# Patient Record
Sex: Female | Born: 1960 | Race: White | Hispanic: No | Smoking: Former smoker
Health system: Southern US, Community
[De-identification: ages and names within clinical notes are randomized; demographics above are authoritative.]

## PROBLEM LIST (undated history)

## (undated) DIAGNOSIS — J45909 Unspecified asthma, uncomplicated: Secondary | ICD-10-CM

## (undated) DIAGNOSIS — G4733 Obstructive sleep apnea (adult) (pediatric): Secondary | ICD-10-CM

## (undated) DIAGNOSIS — I2510 Atherosclerotic heart disease of native coronary artery without angina pectoris: Secondary | ICD-10-CM

## (undated) DIAGNOSIS — J309 Allergic rhinitis, unspecified: Secondary | ICD-10-CM

## (undated) DIAGNOSIS — K219 Gastro-esophageal reflux disease without esophagitis: Secondary | ICD-10-CM

## (undated) HISTORY — DX: Obstructive sleep apnea (adult) (pediatric): G47.33

## (undated) HISTORY — PX: ABDOMINAL HYSTERECTOMY: SHX81

## (undated) HISTORY — DX: Atherosclerotic heart disease of native coronary artery without angina pectoris: I25.10

## (undated) HISTORY — DX: Allergic rhinitis, unspecified: J30.9

## (undated) HISTORY — PX: KIDNEY STONE SURGERY: SHX686

## (undated) HISTORY — PX: APPENDECTOMY: SHX54

## (undated) HISTORY — DX: Gastro-esophageal reflux disease without esophagitis: K21.9

## (undated) HISTORY — DX: Unspecified asthma, uncomplicated: J45.909

---

## 2001-08-21 ENCOUNTER — Ambulatory Visit (HOSPITAL_COMMUNITY): Admission: RE | Admit: 2001-08-21 | Discharge: 2001-08-21 | Payer: Self-pay | Admitting: Radiology

## 2001-08-29 ENCOUNTER — Encounter: Admission: RE | Admit: 2001-08-29 | Discharge: 2001-08-29 | Payer: Self-pay | Admitting: Radiology

## 2007-01-23 ENCOUNTER — Other Ambulatory Visit: Admission: RE | Admit: 2007-01-23 | Discharge: 2007-01-23 | Payer: Self-pay | Admitting: Obstetrics and Gynecology

## 2007-02-01 ENCOUNTER — Ambulatory Visit (HOSPITAL_COMMUNITY): Admission: RE | Admit: 2007-02-01 | Discharge: 2007-02-01 | Payer: Self-pay | Admitting: Obstetrics and Gynecology

## 2008-07-03 ENCOUNTER — Encounter: Admission: RE | Admit: 2008-07-03 | Discharge: 2008-07-03 | Payer: Self-pay | Admitting: Obstetrics and Gynecology

## 2010-03-21 ENCOUNTER — Observation Stay (HOSPITAL_COMMUNITY): Admission: EM | Admit: 2010-03-21 | Discharge: 2010-03-22 | Payer: Self-pay | Admitting: Emergency Medicine

## 2010-08-03 LAB — CBC
MCV: 87 fL (ref 78.0–100.0)
Platelets: 310 10*3/uL (ref 150–400)
RBC: 4.88 MIL/uL (ref 3.87–5.11)
RDW: 15.1 % (ref 11.5–15.5)
WBC: 6.5 10*3/uL (ref 4.0–10.5)

## 2010-08-03 LAB — DIFFERENTIAL
Basophils Absolute: 0.1 10*3/uL (ref 0.0–0.1)
Eosinophils Relative: 2 % (ref 0–5)
Lymphocytes Relative: 32 % (ref 12–46)
Neutro Abs: 3.8 10*3/uL (ref 1.7–7.7)
Neutrophils Relative %: 58 % (ref 43–77)

## 2010-08-03 LAB — BASIC METABOLIC PANEL
BUN: 14 mg/dL (ref 6–23)
Calcium: 9.6 mg/dL (ref 8.4–10.5)
Creatinine, Ser: 0.82 mg/dL (ref 0.4–1.2)
GFR calc Af Amer: >60 mL/min (ref >60)
GFR calc non Af Amer: >60 mL/min (ref >60)

## 2012-01-02 IMAGING — CR DG ABDOMEN 1V
1 series · 1 of 1 positions shown · non-contrast
Comparison: CT 03/21/2010

CLINICAL DATA: Left kidney stone.

ABDOMEN - 1 VIEW

[t abdomen supine]
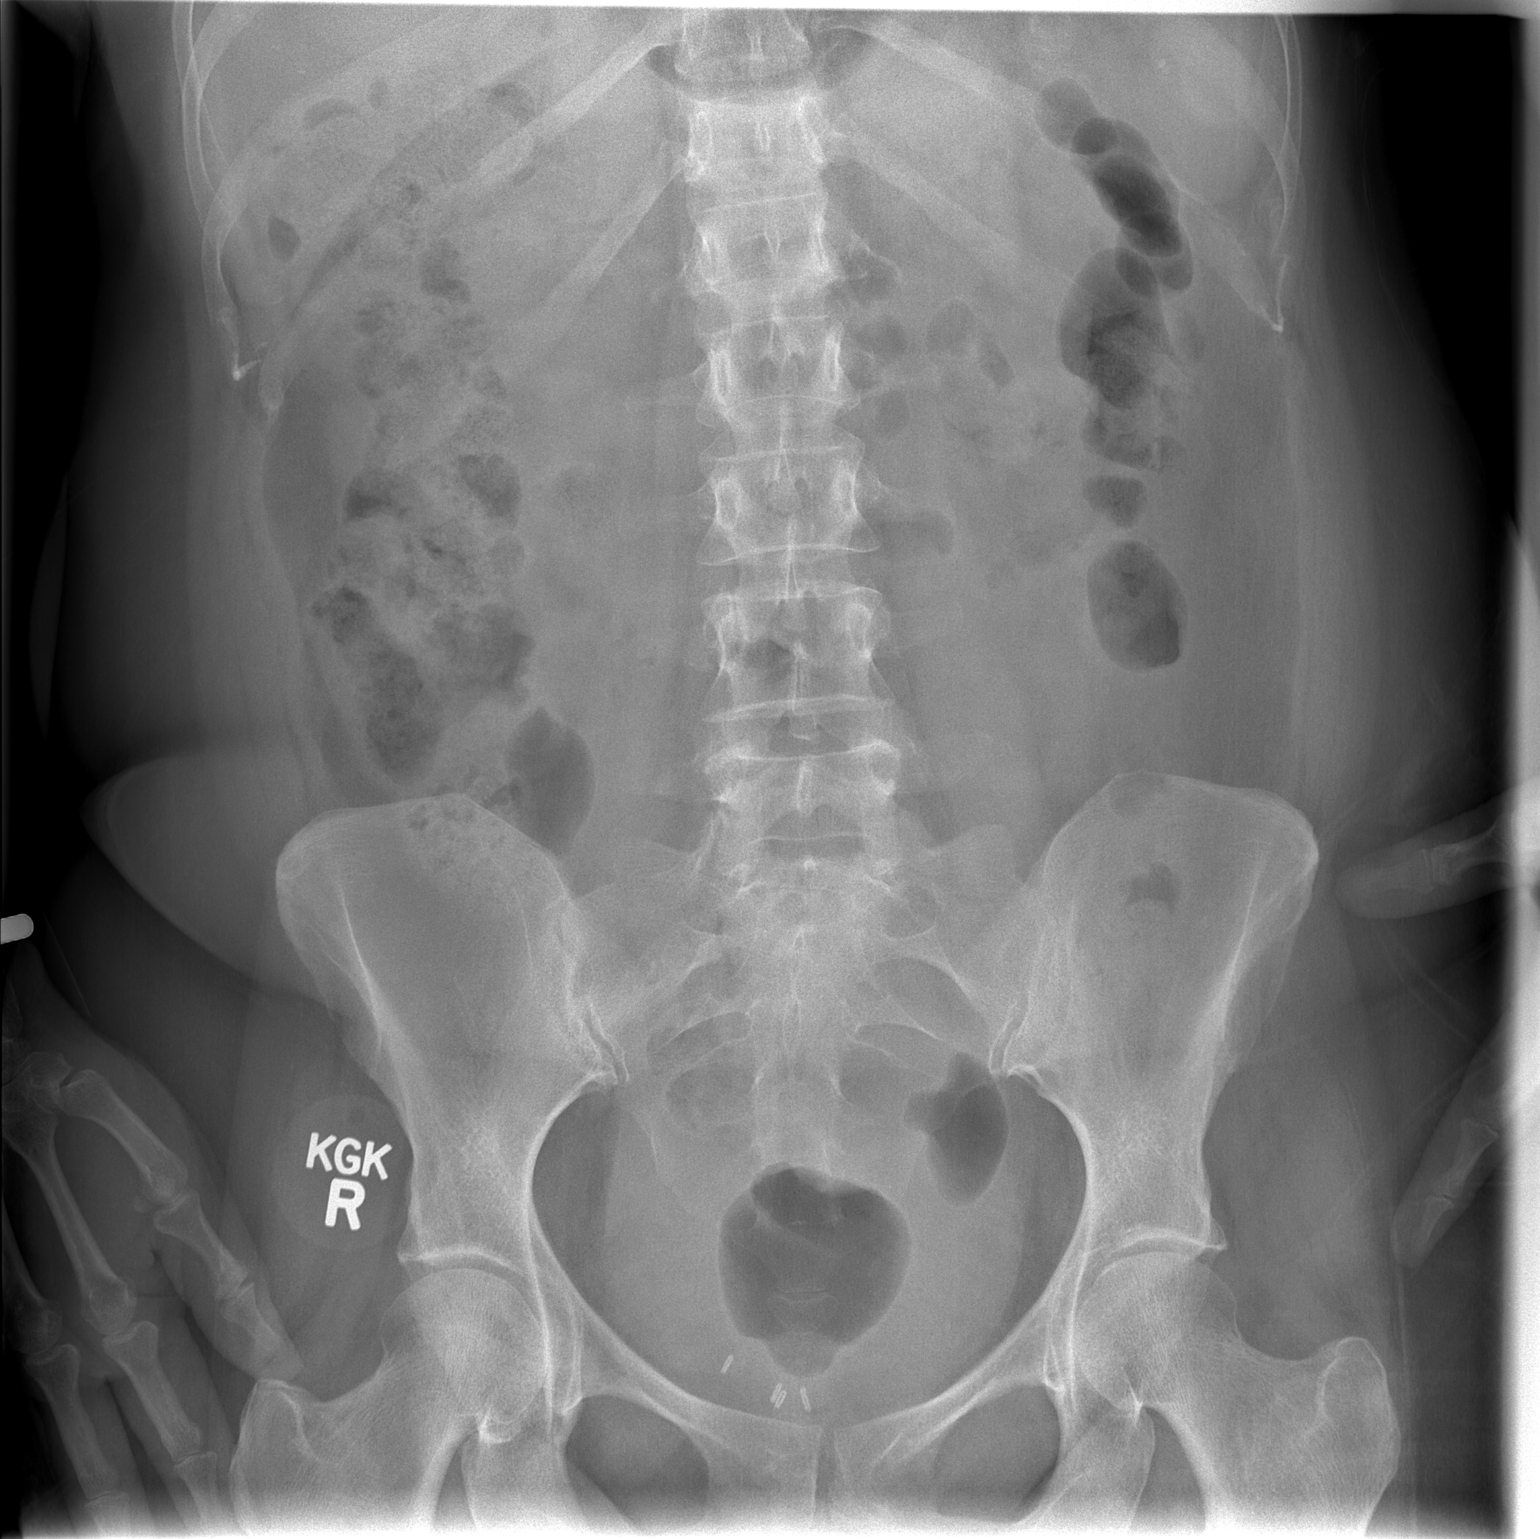

[1 of 1 positions shown; findings below may reference images not displayed]

FINDINGS: The previously seen tiny calcification at the left
ureteral vesicle junction cannot be visualized on plain film.  It
is unclear if this is due to passage of the stone or the fact that
it is too small to visualize (I could not see the stone on the
scout image from yesterday's CT).  There is a normal bowel gas
pattern.  No suspicious calcifications are seen.  No supine
evidence of free air.  No bony abnormality.
IMPRESSION: No radiopaque calculi visualized.  See discussion above.

## 2013-12-20 HISTORY — PX: CORONARY ARTERY BYPASS GRAFT: SHX141

## 2014-07-08 DIAGNOSIS — G4733 Obstructive sleep apnea (adult) (pediatric): Secondary | ICD-10-CM | POA: Insufficient documentation

## 2014-07-08 DIAGNOSIS — E785 Hyperlipidemia, unspecified: Secondary | ICD-10-CM | POA: Insufficient documentation

## 2014-07-08 DIAGNOSIS — N959 Unspecified menopausal and perimenopausal disorder: Secondary | ICD-10-CM | POA: Insufficient documentation

## 2014-07-08 DIAGNOSIS — I2510 Atherosclerotic heart disease of native coronary artery without angina pectoris: Secondary | ICD-10-CM | POA: Insufficient documentation

## 2014-07-08 DIAGNOSIS — J4520 Mild intermittent asthma, uncomplicated: Secondary | ICD-10-CM | POA: Insufficient documentation

## 2015-06-30 DIAGNOSIS — Z951 Presence of aortocoronary bypass graft: Secondary | ICD-10-CM | POA: Insufficient documentation

## 2015-07-03 DIAGNOSIS — K219 Gastro-esophageal reflux disease without esophagitis: Secondary | ICD-10-CM | POA: Insufficient documentation

## 2015-07-03 DIAGNOSIS — J302 Other seasonal allergic rhinitis: Secondary | ICD-10-CM | POA: Insufficient documentation

## 2015-07-03 DIAGNOSIS — N200 Calculus of kidney: Secondary | ICD-10-CM | POA: Insufficient documentation

## 2015-07-03 DIAGNOSIS — E559 Vitamin D deficiency, unspecified: Secondary | ICD-10-CM | POA: Insufficient documentation

## 2015-07-03 DIAGNOSIS — J3089 Other allergic rhinitis: Principal | ICD-10-CM

## 2016-01-25 DIAGNOSIS — M76899 Other specified enthesopathies of unspecified lower limb, excluding foot: Secondary | ICD-10-CM | POA: Insufficient documentation

## 2016-02-02 DIAGNOSIS — M7051 Other bursitis of knee, right knee: Secondary | ICD-10-CM | POA: Insufficient documentation

## 2016-02-02 DIAGNOSIS — M2240 Chondromalacia patellae, unspecified knee: Secondary | ICD-10-CM | POA: Insufficient documentation

## 2017-06-14 ENCOUNTER — Encounter: Payer: Self-pay | Admitting: Allergy and Immunology

## 2017-06-14 ENCOUNTER — Ambulatory Visit (INDEPENDENT_AMBULATORY_CARE_PROVIDER_SITE_OTHER): Payer: Self-pay | Admitting: Allergy and Immunology

## 2017-06-14 VITALS — BP 120/68 | HR 82 | Temp 98.2°F | Resp 20 | Ht 60.0 in | Wt 167.6 lb

## 2017-06-14 DIAGNOSIS — J4520 Mild intermittent asthma, uncomplicated: Secondary | ICD-10-CM

## 2017-06-14 DIAGNOSIS — J302 Other seasonal allergic rhinitis: Secondary | ICD-10-CM

## 2017-06-14 DIAGNOSIS — R0600 Dyspnea, unspecified: Secondary | ICD-10-CM | POA: Insufficient documentation

## 2017-06-14 DIAGNOSIS — J3089 Other allergic rhinitis: Secondary | ICD-10-CM

## 2017-06-14 DIAGNOSIS — R0609 Other forms of dyspnea: Secondary | ICD-10-CM

## 2017-06-14 MED ORDER — IPRATROPIUM BROMIDE 0.06 % NA SOLN: NASAL | 5 refills | Status: AC

## 2017-06-14 MED ORDER — FLUTICASONE-UMECLIDIN-VILANT 100-62.5-25 MCG/INH IN AEPB: 1.0000 | INHALATION_SPRAY | Freq: Every day | RESPIRATORY_TRACT | 5 refills | Status: AC

## 2017-06-14 NOTE — Assessment & Plan Note (Addendum)
Unclear etiology.  Spirometry today was normal without significant reversibility despite the fact that the study was performed while the patient had symptoms.  In addition, she has derived no benefit from inhaled corticosteroids or leukotriene inhibitors.  Her records indicate that she has had a negative methacholine challenge.  As a therapeutic trial, we will provide a sample of Trelegy Ellipta, 1 inhalation daily.  If she derives no benefit from this, we will refer to otolaryngology, Dr. Suszanne Connerseoh.  Continue to have access to albuterol HFA, 1-2 inhalations every 6 hours if needed.  She will follow up with her cardiologist, Dr. Red Christianshui.

## 2017-06-14 NOTE — Patient Instructions (Addendum)
Dyspnea Unclear etiology.  Spirometry today was normal without significant reversibility despite the fact that the study was performed while the patient had symptoms.  In addition, she has derived no benefit from inhaled corticosteroids or leukotriene inhibitors.  Her records indicate that she has had a negative methacholine challenge.  As a therapeutic trial, we will provide a sample of Trelegy Ellipta, 1 inhalation daily.  If she derives no benefit from this, we will refer to otolaryngology, Dr. Suszanne Connerseoh.  Continue to have access to albuterol HFA, 1-2 inhalations every 6 hours if needed.  She will follow up with her cardiologist, Dr. Red Christianshui.  Perennial allergic rhinitis with seasonal variation She is unable to use intranasal steroids due to history of epistaxis and has a medication allergy contraindication with azelastine nasal spray.  Aeroallergen avoidance measures have been discussed and provided in written form.  A prescription has been provided for ipratropium 0.06% nasal spray, 2 sprays per nostril 3-4 times daily as needed. Proper nasal spray technique has been discussed and demonstrated.   Nasal saline spray (i.e., Simply Saline) or nasal saline lavage (i.e., NeilMed) is recommended as needed and prior to medicated nasal sprays.  For thick post nasal drainage, add guaifenesin [phone] mg (Mucinex)  twice daily as needed with adequate hydration as discussed.   The patient has been asked to call me with an update of her progress in the next 3 or 4 weeks, or sooner if needed.

## 2017-06-14 NOTE — Progress Notes (Signed)
New Patient Note  RE: Terri Rangel MRN: [id number] DOB: 07-15-1960 Date of Office Visit: 06/14/2017  Referring provider: Shellia Cleverly, PA Primary care provider: Shellia Cleverly, PA  Chief Complaint: Allergic Rhinitis  and Asthma   History of present illness: Terri Rangel is a 57 y.o. female seen today in consultation requested by Daphane Shepherd, PA.  She reports that she has experienced dyspnea since August 2015 after triple bypass surgery.  The dyspnea occurs "all the time", however increases with even mild physical exertion.  Echocardiogram was normal and she is followed by Dr. Red Christians.  She was seen by Dr. Rachael Darby in [place], however her symptoms failed to improve despite using multiple inhalers.  In 2017 she was evaluated by Dr. Christell Constant.  She is currently taking Qvar 80 g HFA, 2 inhalations via spacer device twice daily, and montelukast 10 mg daily at bedtime.  However, she notes that her symptoms are unchanged whether she takes these medications or not.  Her records indicate that she has had a negative methacholine challenge.  She experiences nasal congestion, rhinorrhea, sneezing, postnasal drainage, and occasional sinus pressure.  These symptoms occur year round.  In addition, she experiences ocular pruritus during the springtime.  She has had these nasal, sinus, ocular symptoms since 1980 after having her first child.   Assessment and plan: Dyspnea Unclear etiology.  Spirometry today was normal without significant reversibility despite the fact that the study was performed while the patient had symptoms.  In addition, she has derived no benefit from inhaled corticosteroids or leukotriene inhibitors.  Her records indicate that she has had a negative methacholine challenge.  As a therapeutic trial, we will provide a sample of Trelegy Ellipta, 1 inhalation daily.  If she derives no benefit from this, we will refer to otolaryngology, Dr. Suszanne Conners.  Continue to have access to albuterol  HFA, 1-2 inhalations every 6 hours if needed.  She will follow up with her cardiologist, Dr. Red Christians.  Perennial allergic rhinitis with seasonal variation She is unable to use intranasal steroids due to history of epistaxis and has a medication allergy contraindication with azelastine nasal spray.  Aeroallergen avoidance measures have been discussed and provided in written form.  A prescription has been provided for ipratropium 0.06% nasal spray, 2 sprays per nostril 3-4 times daily as needed. Proper nasal spray technique has been discussed and demonstrated.   Nasal saline spray (i.e., Simply Saline) or nasal saline lavage (i.e., NeilMed) is recommended as needed and prior to medicated nasal sprays.  For thick post nasal drainage, add guaifenesin [phone] mg (Mucinex)  twice daily as needed with adequate hydration as discussed.   Meds ordered this encounter  Medications  . Fluticasone-Umeclidin-Vilant (TRELEGY ELLIPTA) 100-62.5-25 MCG/INH AEPB    Sig: Inhale 1 puff into the lungs daily.    Dispense:  28 each    Refill:  5  . ipratropium (ATROVENT) 0.06 % nasal spray    Sig: Two sprays each nostril three to four times a day as needed    Dispense:  15 mL    Refill:  5    Diagnostics: Spirometry: FVC was 2.13 L and FEV1 was 1.87 L (83% predicted) with an FEV1 ratio of 111%.  Post bronchodilator improvement of 70 mL (4%) which is not considered significant.  Please see scanned spirometry results for details. Environmental skin testing: Positive to grass pollen, ragweed pollen, weed pollen, tree pollen, molds, and cat hair.   Physical examination: Blood pressure 120/68, pulse 82,  temperature 98.2 F (36.8 C), temperature source Oral, resp. rate 20, height 5' (1.524 m), weight 167 lb 9.6 oz (76 kg), SpO2 96 %.  General: Alert, interactive, 7 or 8 word dyspnea. HEENT: TMs pearly gray, turbinates moderately edematous with crusty discharge, post-pharynx mildly erythematous. Neck: Supple  without lymphadenopathy. Lungs: Clear to auscultation without wheezing, rhonchi or rales. CV: Normal S1, S2 without murmurs. Abdomen: Nondistended, nontender. Skin: Warm and dry, without lesions or rashes. Extremities:  No clubbing, cyanosis or edema. Neuro:   Grossly intact.  Review of systems:  Review of systems negative except as noted in HPI / PMHx or noted below: Review of Systems  Constitutional: Negative.   HENT: Negative.   Eyes: Negative.   Respiratory: Negative.   Cardiovascular: Negative.   Gastrointestinal: Negative.   Genitourinary: Negative.   Musculoskeletal: Negative.   Skin: Negative.   Neurological: Negative.   Endo/Heme/Allergies: Negative.   Psychiatric/Behavioral: Negative.     Past medical history:  Past Medical History:  Diagnosis Date  . Allergic rhinitis   . Asthma   . Coronary artery disease   . GERD (gastroesophageal reflux disease)   . Obstructive sleep apnea     Past surgical history:  Past Surgical History:  Procedure Laterality Date  . ABDOMINAL HYSTERECTOMY    . APPENDECTOMY    . CORONARY ARTERY BYPASS GRAFT  12/2013  . KIDNEY STONE SURGERY      Family history: Family History  Problem Relation Age of Onset  . Allergic rhinitis Mother   . Emphysema Maternal Grandfather   . Angioedema Neg Hx   . Asthma Neg Hx   . Eczema Neg Hx   . Immunodeficiency Neg Hx   . Urticaria Neg Hx     Social history: Social History   Socioeconomic History  . Marital status: Divorced    Spouse name: Not on file  . Number of children: Not on file  . Years of education: Not on file  . Highest education level: Not on file  Social Needs  . Financial resource strain: Not on file  . Food insecurity - worry: Not on file  . Food insecurity - inability: Not on file  . Transportation needs - medical: Not on file  . Transportation needs - non-medical: Not on file  Occupational History  . Not on file  Tobacco Use  . Smoking status: Former Smoker     Packs/day: 0.25    Types: Cigarettes  . Smokeless tobacco: Never Used  . Tobacco comment: smoked in high school.  one cigarette a day.  did not inhale.  Substance and Sexual Activity  . Alcohol use: Yes    Alcohol/week: 1.2 oz    Types: 1 Glasses of wine, 1 Cans of beer per week  . Drug use: No  . Sexual activity: Yes  Other Topics Concern  . Not on file  Social History Narrative  . Not on file   Environmental History: Patient lives in a 58 year old house.  She is a non-smoker without pets.  Allergies as of 06/14/2017      Reactions   Codeine Nausea And Vomiting   Neosporin Plus Max St Itching   Povidone Iodine    Other reaction(s): Redness   Povidone-iodine Other (See Comments), Rash   Generalized redness Other reaction(s): Redness burns   Pramoxine Itching   Sulfa Antibiotics Itching, Rash   Sulfasalazine Rash   Benzalkonium Chloride Other (See Comments)   Skin infection infection      Medication List  Accurate as of 06/14/17 11:18 AM. Always use your most recent med list.          acetaminophen 500 MG tablet Commonly known as:  TYLENOL Take by mouth.   albuterol (2.5 MG/3ML) 0.083% nebulizer solution Commonly known as:  PROVENTIL   Albuterol Sulfate 108 (90 Base) MCG/ACT Aepb Inhale into the lungs.   aspirin EC 81 MG tablet Take by mouth.   atorvastatin 80 MG tablet Commonly known as:  LIPITOR Take 80 mg by mouth.   cetirizine 10 MG tablet Commonly known as:  ZYRTEC Take 10 mg by mouth daily.   ezetimibe 10 MG tablet Commonly known as:  ZETIA Take by mouth.   Fluticasone-Umeclidin-Vilant 100-62.5-25 MCG/INH Aepb Commonly known as:  TRELEGY ELLIPTA Inhale 1 puff into the lungs daily.   HYDROcodone-acetaminophen 7.5-325 MG tablet Commonly known as:  NORCO Take by mouth.   ipratropium 0.02 % nebulizer solution Commonly known as:  ATROVENT Take 2.5 mLs (0.5 mg total) by nebulization 3 times daily.   ipratropium 0.06 % nasal  spray Commonly known as:  ATROVENT Two sprays each nostril three to four times a day as needed   meloxicam 15 MG tablet Commonly known as:  MOBIC Take by mouth.   metoprolol succinate 25 MG 24 hr tablet Commonly known as:  TOPROL-XL Take by mouth.   montelukast 10 MG tablet Commonly known as:  SINGULAIR Take 10 mg by mouth.   pantoprazole 40 MG tablet Commonly known as:  PROTONIX Take 40 mg by mouth.   QVAR 80 MCG/ACT inhaler Generic drug:  beclomethasone Inhale 2 puffs into the lungs 2 (two) times daily. Use spacer.   ranitidine 150 MG tablet Commonly known as:  ZANTAC Take by mouth.   rOPINIRole 2 MG tablet Commonly known as:  REQUIP Take 2 mg by mouth.   traMADol 50 MG tablet Commonly known as:  ULTRAM Take by mouth.   venlafaxine XR 75 MG 24 hr capsule Commonly known as:  EFFEXOR-XR Take 3 capsules daily   Vitamin D (Ergocalciferol) 50000 units Caps capsule Commonly known as:  DRISDOL Take by mouth.       Known medication allergies: Allergies  Allergen Reactions  . Codeine Nausea And Vomiting  . Neosporin Plus Max St Itching  . Povidone Iodine     Other reaction(s): Redness  . Povidone-Iodine Other (See Comments) and Rash    Generalized redness Other reaction(s): Redness burns   . Pramoxine Itching  . Sulfa Antibiotics Itching and Rash  . Sulfasalazine Rash  . Benzalkonium Chloride Other (See Comments)    Skin infection infection     I appreciate the opportunity to take part in Terri Rangel's care. Please do not hesitate to contact me with questions.  Sincerely,   R. Jorene Guestarter Catherine Cubero, MD

## 2017-06-14 NOTE — Assessment & Plan Note (Addendum)
She is unable to use intranasal steroids due to history of epistaxis and has a medication allergy contraindication with azelastine nasal spray.  Aeroallergen avoidance measures have been discussed and provided in written form.  A prescription has been provided for ipratropium 0.06% nasal spray, 2 sprays per nostril 3-4 times daily as needed. Proper nasal spray technique has been discussed and demonstrated.   Nasal saline spray (i.e., Simply Saline) or nasal saline lavage (i.e., NeilMed) is recommended as needed and prior to medicated nasal sprays.  For thick post nasal drainage, add guaifenesin [phone] mg (Mucinex)  twice daily as needed with adequate hydration as discussed.

## 2017-09-12 ENCOUNTER — Telehealth: Payer: Self-pay | Admitting: Allergy

## 2017-09-12 NOTE — Telephone Encounter (Signed)
It might not be a bad idea to rule out asthma with a methacholine challenge prior to referring to ENT. If methacholine challenge is negative we will refer to Dr. Suszanne Connerseoh. Please refer patient for methacholine challenge. Thanks.

## 2017-09-12 NOTE — Telephone Encounter (Signed)
done

## 2017-09-12 NOTE — Telephone Encounter (Signed)
Terri Rangel. Terri Rangel,Terri Rangel called and said the Trelegy helped her and some days it didn't..She said  You said something [place] like [place] be [place]. Her phone number is [phone]. [employer]

## 2017-09-12 NOTE — Telephone Encounter (Signed)
Per Staff message from Dr. Nunzio CobbsBobbitt:  Please disregard previous note. She already had a negative methacholine challenge. Please refer to Dr. Suszanne Connerseoh. Thanks.

## 2017-09-13 NOTE — Telephone Encounter (Signed)
Please refer to Dr.Teoh 

## 2018-06-13 DIAGNOSIS — J309 Allergic rhinitis, unspecified: Secondary | ICD-10-CM
